# Patient Record
Sex: Male | Born: 1975 | Hispanic: Yes | Marital: Married | Smoking: Current every day smoker
Health system: Southern US, Community
[De-identification: ages and names within clinical notes are randomized; demographics above are authoritative.]

## PROBLEM LIST (undated history)

## (undated) DIAGNOSIS — E7800 Pure hypercholesterolemia, unspecified: Secondary | ICD-10-CM

## (undated) HISTORY — PX: NO PAST SURGERIES: SHX2092

---

## 2011-03-17 ENCOUNTER — Encounter (HOSPITAL_COMMUNITY): Payer: Self-pay | Admitting: *Deleted

## 2011-03-17 ENCOUNTER — Emergency Department (HOSPITAL_COMMUNITY): Payer: Managed Care, Other (non HMO)

## 2011-03-17 ENCOUNTER — Emergency Department (HOSPITAL_COMMUNITY)
Admission: EM | Admit: 2011-03-17 | Discharge: 2011-03-17 | Disposition: A | Discharge status: Home / Self Care | Payer: Managed Care, Other (non HMO) | Attending: Emergency Medicine | Admitting: Emergency Medicine

## 2011-03-17 DIAGNOSIS — E7800 Pure hypercholesterolemia, unspecified: Secondary | ICD-10-CM | POA: Insufficient documentation

## 2011-03-17 DIAGNOSIS — R29810 Facial weakness: Secondary | ICD-10-CM | POA: Insufficient documentation

## 2011-03-17 DIAGNOSIS — Z79899 Other long term (current) drug therapy: Secondary | ICD-10-CM | POA: Insufficient documentation

## 2011-03-17 DIAGNOSIS — G510 Bell's palsy: Secondary | ICD-10-CM | POA: Insufficient documentation

## 2011-03-17 DIAGNOSIS — R51 Headache: Secondary | ICD-10-CM | POA: Insufficient documentation

## 2011-03-17 HISTORY — DX: Pure hypercholesterolemia, unspecified: E78.00

## 2011-03-17 MED ORDER — CVS EYE LUBRICANT NIGHTTIME OP OINT: 1.0000 "application " | TOPICAL_OINTMENT | Freq: Three times a day (TID) | OPHTHALMIC | Status: DC

## 2011-03-17 NOTE — ED Provider Notes (Signed)
History     CSN: [id number]  Arrival date & time 03/17/11  1154   First MD Initiated Contact with Patient 03/17/11 1330      Chief Complaint  Patient presents with  . Headache    started on sunday to right side of head  . facial palsy right side of face     right eyebrow doesnot raise     Patient is a 36 y.o. male presenting with headaches. The history is provided by the patient.  Headache  This is a new problem. The current episode started 2 days ago. The problem occurs constantly. The problem has been gradually improving. The headache is associated with nothing. The pain is located in the right unilateral region. The quality of the pain is described as dull. The pain is mild. The pain does not radiate.  pt reports mild right sided HA, no trauma, no fever, no arm weakness, no neck pain, no recent travel.  HA was not sudden onset.  The HA is improving  He also reports right facial droop >24 hours.  Also reports difficulty closing right eye.  Reports "film" over right eye but no visual loss   Reports mild numbness to right face.  No arm/leg drift reported.  He was seen by PCP, given prednisone and sent to ED for further eval.    Past Medical History  Diagnosis Date  . Hypercholesteremia     History reviewed. No pertinent past surgical history.  No family history on file.  History  Substance Use Topics  . Smoking status: Current Everyday Smoker  . Smokeless tobacco: Not on file  . Alcohol Use: Yes     occ      Review of Systems  Neurological: Positive for headaches.  All other systems reviewed and are negative.    Allergies  Review of patient's allergies indicates no known allergies.  Home Medications   Current Outpatient Rx  Name Route Sig Dispense Refill  . ASPIRIN PO Oral Take 1 tablet by mouth daily as needed. For pain    . ATORVASTATIN CALCIUM 20 MG PO TABS Oral Take 20 mg by mouth daily.    . CVS EYE LUBRICANT NIGHTTIME OP OINT Ophthalmic Apply 1  application to eye 3 (three) times daily. 3.5 g 0    BP 110/70  Pulse 56  Temp 98.5 F (36.9 C)  Resp 16  SpO2 99%  Physical Exam CONSTITUTIONAL: Well developed/well nourished HEAD AND FACE: Normocephalic/atraumatic EYES: EOMI/PERRL, no erythema noted ENMT: Mucous membranes moist, left TM/right TM intact/normal NECK: supple no meningeal signs No bruits SPINE:entire spine nontender CV: S1/S2 noted, no murmurs/rubs/gallops noted LUNGS: Lungs are clear to auscultation bilaterally, no apparent distress ABDOMEN: soft, nontender, no rebound or guarding NEURO: Pt is awake/alert, moves all extremitiesx4 Gait normal No arm/leg drift No pastpointing Right facial droop noted, and he is unable to lift eyebrow and unable to close right eye Reports numbness to right face All other cranial nerves grossly intact EXTREMITIES: pulses normal, full ROM SKIN: warm, color normal PSYCH: no abnormalities of mood noted  ED Course  Procedures  Labs Reviewed - No data to display Ct Head Wo Contrast  03/17/2011  *RADIOLOGY REPORT*  Clinical Data: Head pain and right face pain and numbness in the right side of the neck.  CT HEAD WITHOUT CONTRAST  Technique:  Contiguous axial images were obtained from the base of the skull through the vertex without contrast.  Comparison: None.  Findings: There is no acute intracranial  hemorrhage, infarction, or mass lesion.  Brain parenchyma is normal.  Osseous structures are normal.  IMPRESSION: Normal exam.  Original Report Authenticated By: Gwynn Burly, M.D.     1. Bell's palsy       MDM  Nursing notes reviewed and considered in documentation   Pt with obvious bell's palsy Well appearing I spoke to his PCP (Dr Mayford Knife) He felt this was Bells, however he requested CT scan due to HA CT scan performed and negative  His PCP is referring to neuro Also, I will refer to ophtho, and I stressed need to tape eye shut at night, start eye lubricant PCP  already gave prednisone        Joya Gaskins, MD 03/17/11 1709

## 2011-03-17 NOTE — ED Notes (Signed)
Pt is here with right sided headache that started on Sunday and started having right facial palsy on yesterday and cannot raise left eyebrow

## 2011-03-17 NOTE — ED Notes (Signed)
Discharge instructions reviewed with pt,  All questions answered appropriately.  Verbalized understanding.  Pt ambulatory to lobby.  NAD noted.

## 2012-11-13 IMAGING — CT CT HEAD W/O CM
1 series · 16 of 30 positions shown, 20 images · non-contrast
Comparison: None.

CLINICAL DATA: Head pain and right face pain and numbness in the
right side of the neck.

CT HEAD WITHOUT CONTRAST
TECHNIQUE: Contiguous axial images were obtained from the base of
the skull through the vertex without contrast.

[Series 2: head routine 4.8 h37s · axial · 0.43mm/px · z∈[-103,+60]mm · 16 of 36 slices shown, 20 images]
[im 2/36  brain]
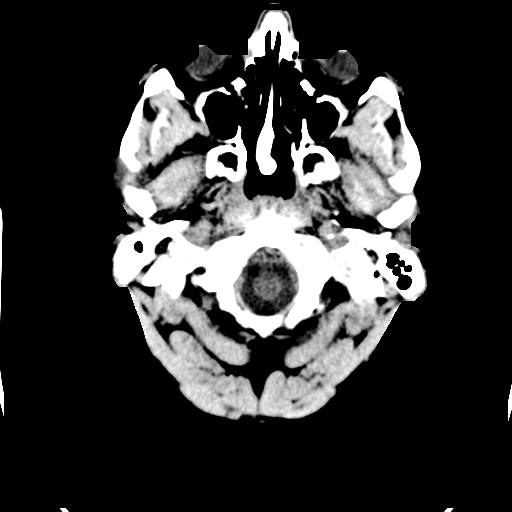
[im 2/36  bone]
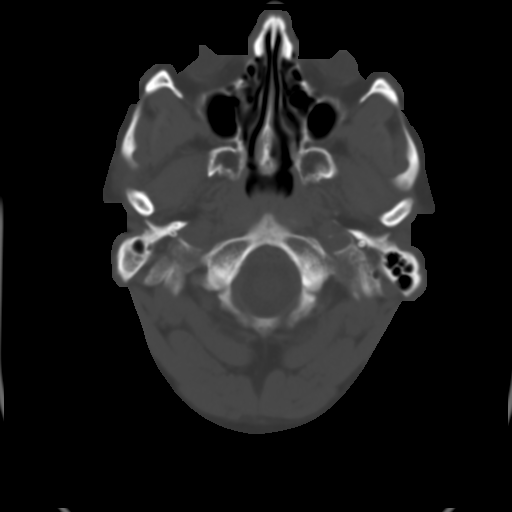
[im 4/36  brain]
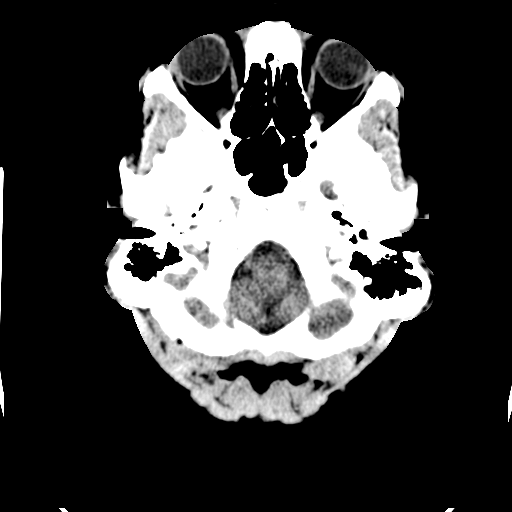
[im 7/36  brain]
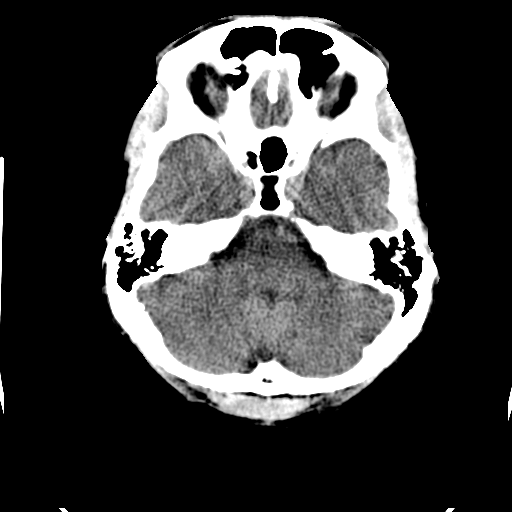
[im 9/36  brain]
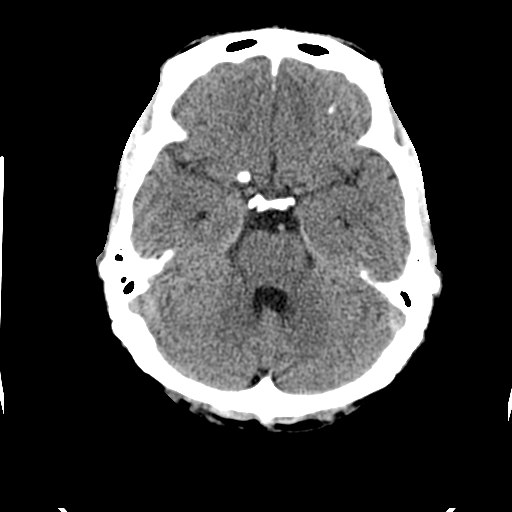
[im 10/36  brain]
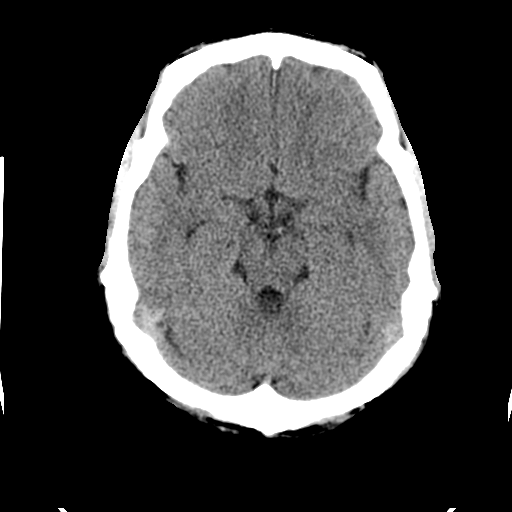
[im 10/36  bone]
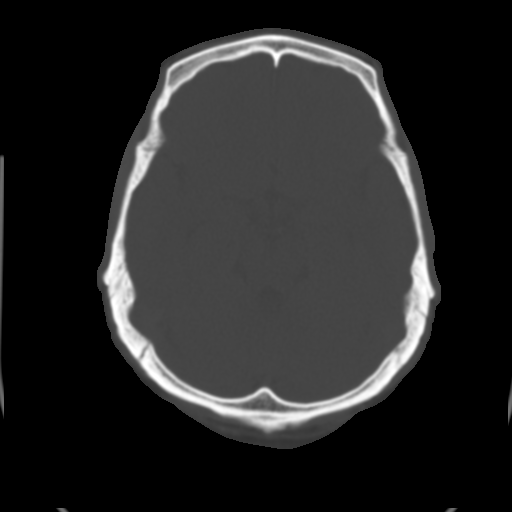
[im 13/36  brain]
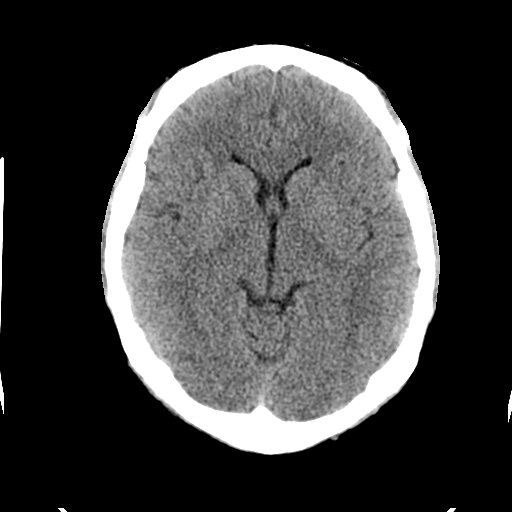
[im 15/36  brain]
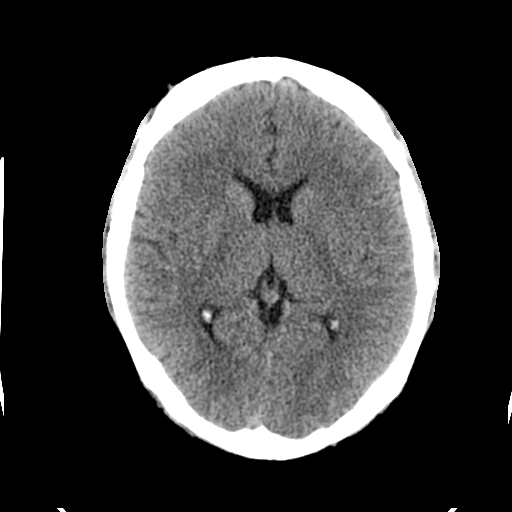
[im 17/36  brain]
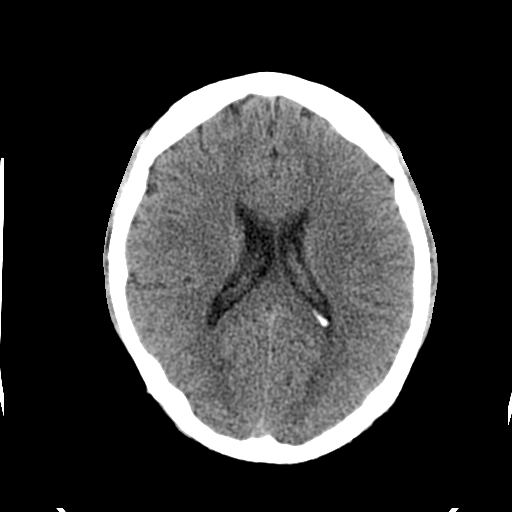
[im 19/36  brain]
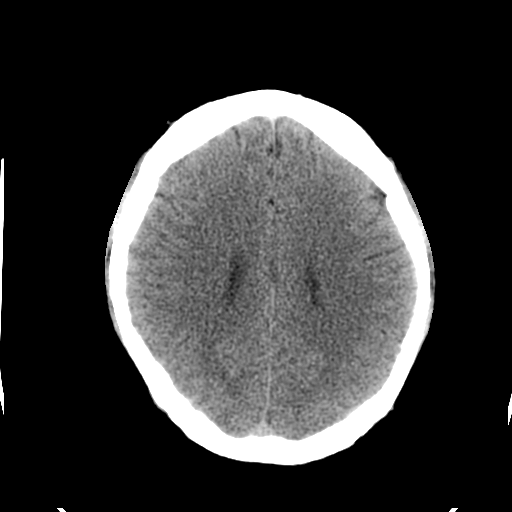
[im 19/36  bone]
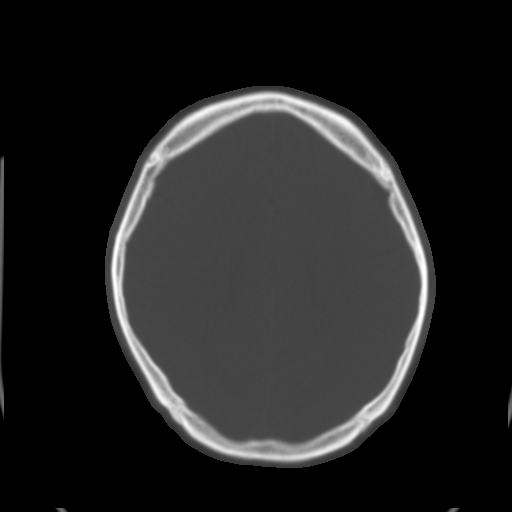
[im 21/36  brain]
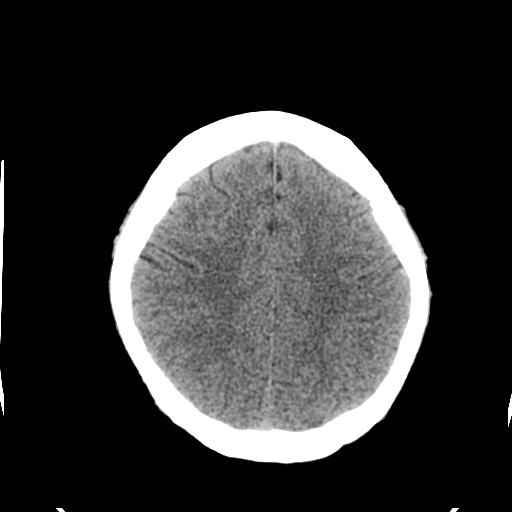
[im 23/36  brain]
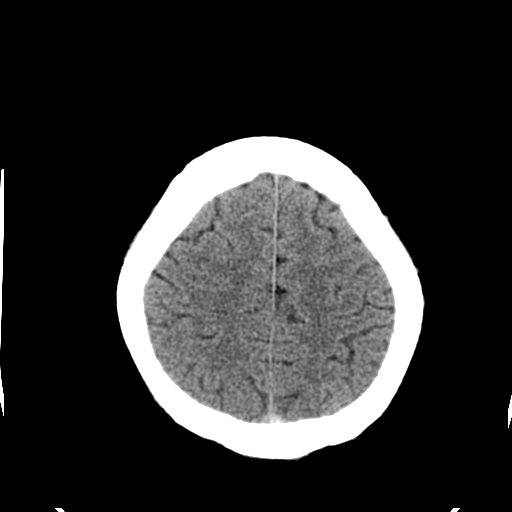
[im 26/36  brain]
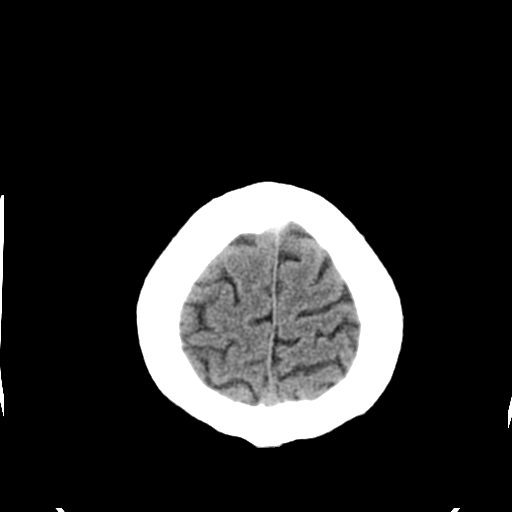
[im 27/36  brain]
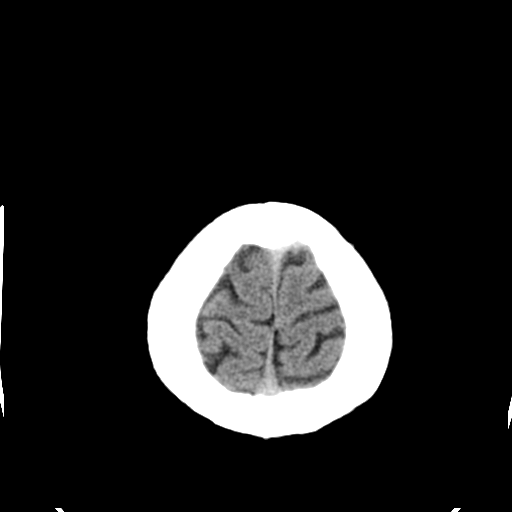
[im 27/36  bone]
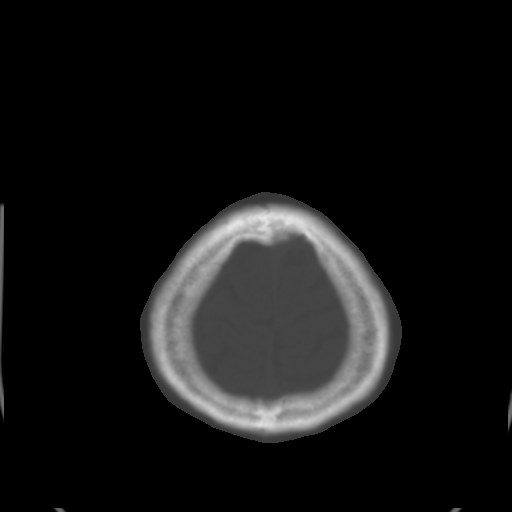
[im 29/36  brain]
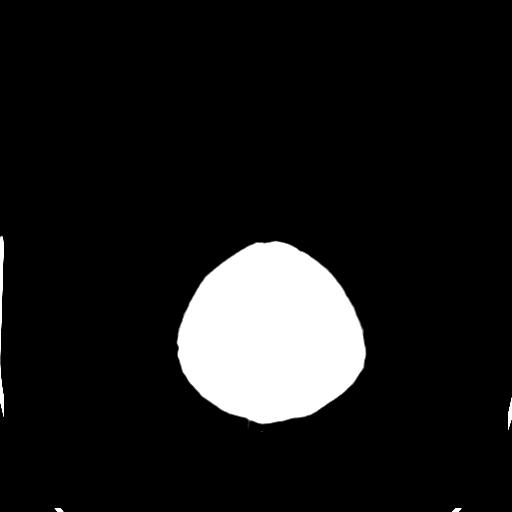
[im 32/36  brain]
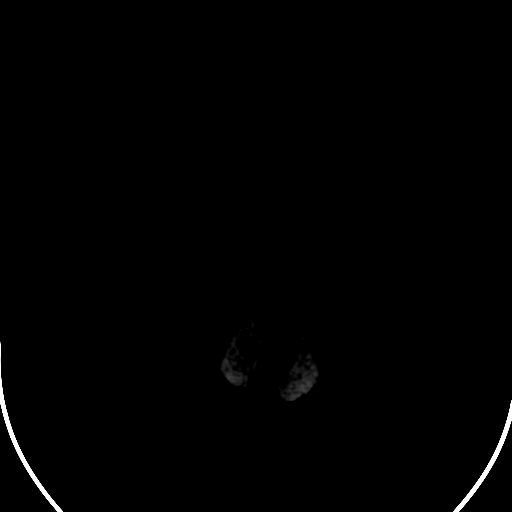
[im 34/36  brain]
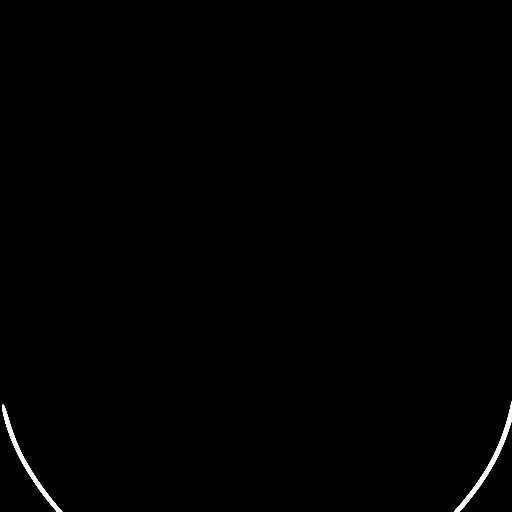

[16 of 30 positions shown; findings below may reference images not displayed]

FINDINGS: There is no acute intracranial hemorrhage, infarction, or
mass lesion.  Brain parenchyma is normal.  Osseous structures are
normal.
IMPRESSION: Normal exam.

## 2014-01-19 ENCOUNTER — Encounter (HOSPITAL_COMMUNITY): Payer: Self-pay | Admitting: Emergency Medicine

## 2014-01-19 ENCOUNTER — Emergency Department (HOSPITAL_COMMUNITY)
Admission: EM | Admit: 2014-01-19 | Discharge: 2014-01-19 | Disposition: A | Discharge status: Home / Self Care | Payer: Worker's Compensation | Attending: Emergency Medicine | Admitting: Emergency Medicine

## 2014-01-19 DIAGNOSIS — H578 Other specified disorders of eye and adnexa: Secondary | ICD-10-CM | POA: Insufficient documentation

## 2014-01-19 DIAGNOSIS — Z79899 Other long term (current) drug therapy: Secondary | ICD-10-CM | POA: Diagnosis not present

## 2014-01-19 DIAGNOSIS — H5711 Ocular pain, right eye: Secondary | ICD-10-CM | POA: Insufficient documentation

## 2014-01-19 DIAGNOSIS — Z7982 Long term (current) use of aspirin: Secondary | ICD-10-CM | POA: Diagnosis not present

## 2014-01-19 DIAGNOSIS — Z720 Tobacco use: Secondary | ICD-10-CM | POA: Diagnosis not present

## 2014-01-19 DIAGNOSIS — E780 Pure hypercholesterolemia: Secondary | ICD-10-CM | POA: Insufficient documentation

## 2014-01-19 MED ORDER — OFLOXACIN 0.3 % OP SOLN
1.0000 [drp] | Freq: Once | OPHTHALMIC | Status: AC
  Administered 2014-01-19: 1 [drp] via OPHTHALMIC
  Filled 2014-01-19: qty 5

## 2014-01-19 NOTE — ED Provider Notes (Signed)
CSN: [id number]     Arrival date & time 01/19/14  1518 History   None    Chief Complaint  Patient presents with  . Eye Pain   Patient is a 38 y.o. male presenting with eye pain. The history is provided by the patient. No language interpreter was used.  Eye Pain This is a new problem. The current episode started yesterday. The problem occurs constantly.   This chart was scribed for physician practitioner working with Rolland PorterMark James, MD, by Andrew Auaven Small, ED Scribe. This patient was seen in room TR04C/TR04C and the patient's care was started at 4:07 PM.  Marquette OldMiguel A Noyola-Parra is a 38 y.o. male who presents to the Emergency Department complaining of right eye pain with associated erythema. Pt states she was seen by an optometrist today for possible cornea infection and was sent to the ED. Pt states eye pain began 1 day ago while at work, working with leaves. He believes something may have gotten into his eye. Pt denies fever, chills and blurry vision.     Past Medical History  Diagnosis Date  . Hypercholesteremia    History reviewed. No pertinent past surgical history. No family history on file. History  Substance Use Topics  . Smoking status: Current Every Day Smoker  . Smokeless tobacco: Not on file  . Alcohol Use: Yes     Comment: occ    Review of Systems  Constitutional: Negative for fever and chills.  Eyes: Positive for pain and redness. Negative for visual disturbance.  All other systems reviewed and are negative.   Allergies  Review of patient's allergies indicates no known allergies.  Home Medications   Prior to Admission medications   Medication Sig Start Date End Date Taking? Authorizing Provider  ASPIRIN PO Take 1 tablet by mouth daily as needed. For pain    Historical Provider, MD  atorvastatin (LIPITOR) 20 MG tablet Take 20 mg by mouth daily.    Historical Provider, MD  White Petrolatum-Mineral Oil (CVS EYE LUBRICANT NIGHTTIME) OINT Apply 1 application to eye 3  (three) times daily. 03/17/11   Joya Gaskinsonald W Wickline, MD   BP 130/70 mmHg  Pulse 69  Temp(Src) 98.1 F (36.7 C) (Oral)  Resp 18  Ht 5\' 6"  (1.676 m)  Wt 178 lb (80.74 kg)  BMI 28.74 kg/m2  SpO2 99% Physical Exam  Constitutional: He is oriented to person, place, and time. He appears well-developed and well-nourished. No distress.  HENT:  Head: Normocephalic and atraumatic.  Eyes: EOM are normal. Right conjunctiva is injected.  Neck: Neck supple.  Cardiovascular: Normal rate, regular rhythm and normal heart sounds.  Exam reveals no gallop and no friction rub.   No murmur heard. Pulmonary/Chest: Breath sounds normal. No respiratory distress. He has no wheezes. He has no rales. He exhibits no tenderness.  Musculoskeletal: Normal range of motion.  Neurological: He is alert and oriented to person, place, and time.  Skin: Skin is warm and dry.  Psychiatric: He has a normal mood and affect. His behavior is normal.  Nursing note and vitals reviewed.   ED Course  Procedures (including critical care time) DIAGNOSTIC STUDIES: Oxygen Saturation is 99% on RA, normal by my interpretation.    COORDINATION OF CARE: 4:07 PM- Pt advised of plan for treatment and pt agrees. Labs Review Labs Reviewed - No data to display  Imaging Review No results found.   EKG Interpretation None     Patient sent to the ED from a community opthalmology office  due to suspicion of fungal keratitis.  On exam, patient with injected sclera, but cornea is clear with opacity.  No visual changes, with acuity of 20/15 bilaterally.  Mild eye pain.  Symptoms onset after working in yard cleaning up fallen leaves.  Patient reports Dr. Delaney MeigsStonecipher was suggested.  Dr. Ronnie DossStonecipher's practice is currently limited to lasik procedures.  Contacted on-call opthalmology [facility] for recommendations.  She suggested ofloxacin, one drop in affected eye every 6 hours.  She will meet the patient at her office Saturday morning at [facility]9AM.   Patient agreeable to plan. MDM   Final diagnoses:  None  Right eye pain. Scleritis.   I personally performed the services described in this documentation, which was scribed in my presence. The recorded information has been reviewed and is accurate.      Jimmye Normanavid John Ignacia Gentzler, NP 01/19/14 2014  Rolland PorterMark James, MD 01/25/14 [phone]

## 2014-01-19 NOTE — ED Notes (Signed)
Pharmacy contacted in regards to eye drops. They are sending them now.

## 2014-01-19 NOTE — Discharge Instructions (Signed)
Scleritis and Episcleritis The outer part of the eyeball is covered with a tough fibrous covering called the sclera. It is the white part of the eye. This tough covering also has a thin membrane lying on top of it called the episclera.   When the sclera becomes red and sore (inflamed), it is called scleritis.  When the episclera becomes inflamed, it is called episcleritis. CAUSES   Scleritis is usually more severe and is associated with autoimmune diseases such as:  Rheumatoid arthritis.  Inflammations of the bowel such as Crohn's Disease (regional enteritis).  Ulcerative colitis.  Episcleritis usually has no known cause. SYMPTOMS  Both scleritis and episcleritis cause red patches or a nodule on the eye. DIAGNOSIS  This condition should be examined by an ophthalmologist. This is because very strong medications that have side effects to the body and eye may be required to treat severe attacks. Further investigations into the patient's general health may be necessary. TREATMENT   Episcleritis tends to get better without treatment within a week or two.  Scleritis is more severe. Often, your caregiver will prescribe steroids by mouth (orally) or as drops in the eye. This treatment helps lessen the redness and soreness (inflammation). HOME CARE INSTRUCTIONS   Take all medications as directed.  Keep your follow-up appointments as directed.  Avoid irritation of the involved eye(s).  Stop using hard or soft contact lenses until your caregiver tells you that it is safe to use them. SEEK MEDICAL CARE IF:   Redness or irritation gets worse.  You develop pain or sensitivity to light.  You develop any change in vision in the involved eye(s). Document Released: 02/10/2001 Document Revised: 05/11/2011 Document Reviewed: 06/14/2008 ExitCare Patient Information 2015 [employer]. This information is not intended to replace advice given to you by your health care provider. Make sure you  discuss any questions you have with your health care provider.  

## 2014-01-19 NOTE — ED Notes (Signed)
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 Sent here to possible see Dr. Delaney MeigsStonecipher. R eye noted to be red. Denies blurred vision.

## 2016-07-04 ENCOUNTER — Ambulatory Visit: Payer: Self-pay | Admitting: Physician Assistant

## 2017-04-09 ENCOUNTER — Encounter (INDEPENDENT_AMBULATORY_CARE_PROVIDER_SITE_OTHER): Payer: Self-pay

## 2017-04-09 ENCOUNTER — Encounter: Payer: Self-pay | Admitting: Physician Assistant

## 2017-04-09 ENCOUNTER — Ambulatory Visit: Payer: BLUE CROSS/BLUE SHIELD | Admitting: Physician Assistant

## 2017-04-09 VITALS — BP 110/78 | HR 72 | Ht 65.0 in | Wt 172.0 lb

## 2017-04-09 DIAGNOSIS — K529 Noninfective gastroenteritis and colitis, unspecified: Secondary | ICD-10-CM

## 2017-04-09 DIAGNOSIS — R1013 Epigastric pain: Secondary | ICD-10-CM | POA: Diagnosis not present

## 2017-04-09 DIAGNOSIS — K219 Gastro-esophageal reflux disease without esophagitis: Secondary | ICD-10-CM

## 2017-04-09 MED ORDER — PANTOPRAZOLE SODIUM 40 MG PO TBEC: 40.0000 mg | DELAYED_RELEASE_TABLET | Freq: Two times a day (BID) | ORAL | 2 refills | Status: DC

## 2017-04-09 NOTE — Progress Notes (Signed)
Chief Complaint: Epigastric pain, GERD  HPI:    [name] is a 42 year old Hispanic male with a past medical history as listed below, who was referred to me by Kurtis BushmanGonzalez, Rodalyn, PA for a complaint of epigastric pain and GERD.      According to referring physician's notes, the patient was seen in clinic 03/25/17 and described frequent defecation of at least 4-5 bowel movements per day about 20 minutes after a meal for the past year.  He also described daily symptoms of acid reflux and abdominal bloating despite his use of Omeprazole 40 mg and dietary modifications.    Today, the patient presents to clinic and explains that he started with some epigastric burning pain about 6 months ago.  At that time, he also describes when he would eat he would feel very full.  He also notes that this pain was worse when his stomach was full.  It was also worse with red pepper and spicy foods.  He then started with reflux.  He went to see his primary care provider who started him on Omeprazole 40 mg daily.  Patient tells me he took this almost daily for a few months, but would "skip a few doses here and there".  He is unsure if his symptoms were better on this medication or not because "I can't remember". Patient then went to [place] for 3 weeks and forgot this medication with him.  Since he has been home over the past month he has had an increase in epigastric burning, sensation of fullness and reflux.  He is not on any medication at this time.  Patient has been trying to watch his diet, but admits that this is "hard to do sometimes".    Patient also describes a change in his bowel habits noting that he will sometimes have 4-5 bowel movements within an hour.  This typically happens 1-2 times a week.  He tells me these are non-urgent but he just does not feel completely empty after having one bowel movement.  He has noticed that this may correspond with dairy or other certain types of food.  He tells me this is only  a problem if he is at work and "has to go back to the bathroom 10 minutes after I just came back".    Patient denies fever, chills, blood in stool, melena, weight loss, anorexia, nausea, vomiting or symptoms that awaken him at night.  Past Medical History:  Diagnosis Date  . Hypercholesteremia     Past Surgical History:  Procedure Laterality Date  . NO PAST SURGERIES      Current Outpatient Medications  Medication Sig Dispense Refill  . atorvastatin (LIPITOR) 20 MG tablet Take 20 mg by mouth daily.    Cliffton Asters. White Petrolatum-Mineral Oil (CVS EYE LUBRICANT NIGHTTIME) OINT Apply 1 application to eye 3 (three) times daily. (Patient not taking: Reported on 04/09/2017) 3.5 g 0   No current facility-administered medications for this visit.     Allergies as of 04/09/2017  . (No Known Allergies)    Family History  Problem Relation Age of Onset  . Diabetes Mellitus II Mother     Social History   Socioeconomic History  . Marital status: Married    Spouse name: Not on file  . Number of children: Not on file  . Years of education: Not on file  . Highest education level: Not on file  Social Needs  . Financial resource strain: Not on file  . Food insecurity -  worry: Not on file  . Food insecurity - inability: Not on file  . Transportation needs - medical: Not on file  . Transportation needs - non-medical: Not on file  Occupational History  . Not on file  Tobacco Use  . Smoking status: Current Every Day Smoker  Substance and Sexual Activity  . Alcohol use: Yes    Comment: occ  . Drug use: No  . Sexual activity: Not on file  Other Topics Concern  . Not on file  Social History Narrative  . Not on file    Review of Systems:    Constitutional: No weight loss, fever or chills Skin: No rash Cardiovascular: No chest pain   Respiratory: No SOB  Gastrointestinal: See HPI and otherwise negative Genitourinary: No dysuria  Neurological: No headache Musculoskeletal: No new muscle or  joint pain Hematologic: No bleeding Psychiatric: No history of depression or anxiety   Physical Exam:  Vital signs: BP 110/78   Pulse 72   Ht 5\' 5"  (1.651 m)   Wt 172 lb (78 kg)   BMI 28.62 kg/m   Constitutional:   Pleasant Hispanic male appears to be in NAD, Well developed, Well nourished, alert and cooperative Head:  Normocephalic and atraumatic. Eyes:   PEERL, EOMI. No icterus. Conjunctiva pink. Ears:  Normal auditory acuity. Neck:  Supple Throat: Oral cavity and pharynx without inflammation, swelling or lesion.  Respiratory: Respirations even and unlabored. Lungs clear to auscultation bilaterally.   No wheezes, crackles, or rhonchi.  Cardiovascular: Normal S1, S2. No MRG. Regular rate and rhythm. No peripheral edema, cyanosis or pallor.  Gastrointestinal:  Soft, nondistended, nontender. No rebound or guarding. Normal bowel sounds. No appreciable masses or hepatomegaly. Rectal:  Not performed.  Msk:  Symmetrical without gross deformities. Without edema, no deformity or joint abnormality.  Neurologic:  Alert and  oriented x4;  grossly normal neurologically.  Skin:   Dry and intact without significant lesions or rashes. Psychiatric: Demonstrates good judgement and reason without abnormal affect or behaviors.  No recent labs or imaging.  Assessment: 1. GERD: Patient describes an increase over the past month after he was in [place] for 3 weeks without his Omeprazole, likely this is related; consider gastritis versus H. pylori versus other 2.  Epigastric pain: Patient describes a burning pain which is worse when he is "really full", also worse with spicy foods, likely related to above 3.  Frequent defecation: Patient describes at times he can go up to 4 times after having a meal, he has noted a correlation between dairy products and the symptom; consider lactose intolerance versus IBS  Plan: 1.  Discussed patient's frequent defecation with him.  I do not feel that this is a problem at  this time.  He could try a probiotic and/or Lactaid.  He could also just try to watch his diet more closely. 2.  Instructed the patient to not start his Omeprazole at this time.  Prescribed Pantoprazole 40 mg twice daily, 30-60 minutes before breakfast and dinner for a month. 3.  Reviewed antireflux diet and lifestyle modifications.  Provided him with a handout. 4.  Patient to follow in clinic with me in 1 month, if his symptoms are no better we will discuss an EGD.  He was assigned to Dr. Russella Dar today.  Hyacinth Meeker, PA-C [facility] 04/09/2017, 1:43 PM  Cc: Kurtis Bushman, PA

## 2017-04-09 NOTE — Patient Instructions (Signed)
We have sent the following medications to your pharmacy for you to pick up at your convenience: Pantoprazole 40 mg twice a day  

## 2017-04-09 NOTE — Progress Notes (Signed)
Reviewed and agree with initial management plan.  Lunden Mcleish T. Maleka Contino, MD FACG 

## 2017-05-07 ENCOUNTER — Ambulatory Visit: Payer: BLUE CROSS/BLUE SHIELD | Admitting: Physician Assistant

## 2017-05-07 ENCOUNTER — Encounter: Payer: Self-pay | Admitting: Physician Assistant

## 2017-05-07 VITALS — BP 104/58 | HR 72 | Ht 65.0 in | Wt 177.2 lb

## 2017-05-07 DIAGNOSIS — K529 Noninfective gastroenteritis and colitis, unspecified: Secondary | ICD-10-CM

## 2017-05-07 DIAGNOSIS — R1013 Epigastric pain: Secondary | ICD-10-CM

## 2017-05-07 DIAGNOSIS — K219 Gastro-esophageal reflux disease without esophagitis: Secondary | ICD-10-CM | POA: Diagnosis not present

## 2017-05-07 DIAGNOSIS — R197 Diarrhea, unspecified: Secondary | ICD-10-CM | POA: Diagnosis not present

## 2017-05-07 MED ORDER — PANTOPRAZOLE SODIUM 40 MG PO TBEC: 40.0000 mg | DELAYED_RELEASE_TABLET | Freq: Two times a day (BID) | ORAL | 3 refills | Status: AC

## 2017-05-07 NOTE — Patient Instructions (Addendum)
If you are age 41 or older, your body mass index should be between 23-30. Your Body mass index is 29.49 kg/m. If this is out of the aforementioned range listed, please consider follow up with your Primary Care Provider.  If you are age 41 or younger, your body mass index should be between 19-25. Your Body mass index is 29.49 kg/m. If this is out of the aformentioned range listed, please consider follow up with your Primary Care Provider.   We have sent the following medications to your pharmacy for you to pick up at your convenience: Pantoprazole 40 mg  Start over-the-counter Lactaid before meals, with dairy if you would like to try.  Follow up with Hyacinth MeekerJennifer Lemmon, PA-C in 3-4 months.  We will contact you regarding this appointment when the schedule becomes available.  Thank you for choosing me and [facility].   Hyacinth MeekerJennifer Lemmon, PA-C

## 2017-05-07 NOTE — Progress Notes (Signed)
Chief Complaint: follow up GERD  HPI:    Donald Munoz is a 42 year old Hispanic male with a past medical history as listed below, who returns to clinic today for follow-up of his epigastric pain and GERD.    Seen 04/09/17 with a complaint of epigastric burning pain for 6 months, reflux and frequent defecation after dairy products.  Prescribed Pantoprazole 40 mg twice daily and advised to use a probiotic or Lactaid.  Assigned to Dr. Russella Dar.    Today, explains he is 100% better.  No longer having any epigastric pain or reflux symptoms.  Occasionally he may feel a "twinge" if he eats something "spicy".  Patient is very aware of his diet now and doing well on twice daily PPI.    Does continue with occasional loose stools but can tie this to eating dairy products, typically after drinking coffee, though this symptom has also decreased over the past month.    Denies fever, chills, blood in stool, melena, weight loss, anorexia, nausea or vomiting.  Past Medical History:  Diagnosis Date  . Hypercholesteremia     Past Surgical History:  Procedure Laterality Date  . NO PAST SURGERIES      Current Outpatient Medications  Medication Sig Dispense Refill  . atorvastatin (LIPITOR) 20 MG tablet Take 20 mg by mouth daily.    . pantoprazole (PROTONIX) 40 MG tablet Take 1 tablet (40 mg total) by mouth 2 (two) times daily before a meal. 60 tablet 2   No current facility-administered medications for this visit.     Allergies as of 05/07/2017  . (No Known Allergies)    Family History  Problem Relation Age of Onset  . Diabetes Mellitus II Mother     Social History   Socioeconomic History  . Marital status: Married    Spouse name: Not on file  . Number of children: Not on file  . Years of education: Not on file  . Highest education level: Not on file  Social Needs  . Financial resource strain: Not on file  . Food insecurity - worry: Not on file  . Food insecurity - inability: Not on file    . Transportation needs - medical: Not on file  . Transportation needs - non-medical: Not on file  Occupational History  . Not on file  Tobacco Use  . Smoking status: Current Every Day Smoker    Types: Cigarettes  . Smokeless tobacco: Never Used  . Tobacco comment: 2-4 cigarettes a day  Substance and Sexual Activity  . Alcohol use: Yes    Comment: occ  . Drug use: No  . Sexual activity: Not on file  Other Topics Concern  . Not on file  Social History Narrative  . Not on file    Review of Systems:    Constitutional: No weight loss, fever or chills Cardiovascular: No chest pain Respiratory: No SOB Gastrointestinal: See HPI and otherwise negative   Physical Exam:  Vital signs: BP (!) 104/58   Pulse 72   Ht 5\' 5"  (1.651 m)   Wt 177 lb 3.2 oz (80.4 kg)   BMI 29.49 kg/m   Constitutional:   Pleasant Hispanic male appears to be in NAD, Well developed, Well nourished, alert and cooperative Respiratory: Respirations even and unlabored. Lungs clear to auscultation bilaterally.   No wheezes, crackles, or rhonchi.  Cardiovascular: Normal S1, S2. No MRG. Regular rate and rhythm. No peripheral edema, cyanosis or pallor.  Gastrointestinal:  Soft, nondistended, nontender. No rebound or guarding.  Normal bowel sounds. No appreciable masses or hepatomegaly. Psychiatric:  Demonstrates good judgement and reason without abnormal affect or behaviors.  No recent labs or imaging.  Assessment: 1. GERD: Resolved with Pantoprazole 40 twice daily 2. Epigastric Pain: resolved 3. Loose stools: Only after dairy, has been trying to decrease this in his diet   Plan: 1.  Continue Pantoprazole 40 mg twice daily for the next 2-3 months.  At that time can likely decrease to once daily, pending symptoms.  Refilled prescription today. 2.  Reviewed antireflux diet and lifestyle modifications. 3.  Patient will have follow-up with me in 3-4 months.  Tells me he will see his PCP before then.  If he is doing  better and she feels okay to decrease his Pantoprazole to once daily then he does not need follow-up with me.  Donald MeekerJennifer Zariana Strub, PA-C [facility] 05/07/2017, 1:40 PM  Cc: No ref. provider found

## 2017-05-07 NOTE — Progress Notes (Signed)
Reviewed and agree with initial management plan.  Rynn Markiewicz T. Massa Pe, MD FACG 

## 2017-05-31 ENCOUNTER — Encounter: Payer: Self-pay | Admitting: Physician Assistant

## 2017-08-06 ENCOUNTER — Encounter: Payer: Self-pay | Admitting: Physician Assistant
